# Patient Record
Sex: Male | Born: 1950 | Race: White | Hispanic: No | Marital: Married | Smoking: Never smoker
Health system: Southern US, Community
[De-identification: ages and names within clinical notes are randomized; demographics above are authoritative.]

## PROBLEM LIST (undated history)

## (undated) DIAGNOSIS — G40909 Epilepsy, unspecified, not intractable, without status epilepticus: Secondary | ICD-10-CM

## (undated) HISTORY — PX: BILATERAL CARPAL TUNNEL RELEASE: SHX6508

---

## 2017-01-02 ENCOUNTER — Emergency Department (HOSPITAL_COMMUNITY)
Admission: EM | Admit: 2017-01-02 | Discharge: 2017-01-02 | Disposition: A | Discharge status: Home / Self Care | Payer: BLUE CROSS/BLUE SHIELD | Attending: Emergency Medicine | Admitting: Emergency Medicine

## 2017-01-02 ENCOUNTER — Emergency Department (HOSPITAL_COMMUNITY): Payer: BLUE CROSS/BLUE SHIELD

## 2017-01-02 ENCOUNTER — Encounter (HOSPITAL_COMMUNITY): Payer: Self-pay

## 2017-01-02 DIAGNOSIS — Z79899 Other long term (current) drug therapy: Secondary | ICD-10-CM | POA: Diagnosis not present

## 2017-01-02 DIAGNOSIS — H532 Diplopia: Secondary | ICD-10-CM | POA: Diagnosis not present

## 2017-01-02 DIAGNOSIS — I639 Cerebral infarction, unspecified: Secondary | ICD-10-CM | POA: Diagnosis present

## 2017-01-02 HISTORY — DX: Epilepsy, unspecified, not intractable, without status epilepticus: G40.909

## 2017-01-02 LAB — CBC
HCT: 42.4 % (ref 39.0–52.0)
HEMOGLOBIN: 14.6 g/dL (ref 13.0–17.0)
MCH: 31.8 pg (ref 26.0–34.0)
MCHC: 34.4 g/dL (ref 30.0–36.0)
MCV: 92.4 fL (ref 78.0–100.0)
Platelets: 181 10*3/uL (ref 150–400)
RBC: 4.59 MIL/uL (ref 4.22–5.81)
RDW: 12.4 % (ref 11.5–15.5)
WBC: 6.2 10*3/uL (ref 4.0–10.5)

## 2017-01-02 LAB — COMPREHENSIVE METABOLIC PANEL
ALBUMIN: 3.8 g/dL (ref 3.5–5.0)
ALT: 13 U/L — AB (ref 17–63)
AST: 15 U/L (ref 15–41)
Alkaline Phosphatase: 45 U/L (ref 38–126)
Anion gap: 9 (ref 5–15)
BILIRUBIN TOTAL: 0.4 mg/dL (ref 0.3–1.2)
BUN: 11 mg/dL (ref 6–20)
CO2: 21 mmol/L — ABNORMAL LOW (ref 22–32)
Calcium: 8.7 mg/dL — ABNORMAL LOW (ref 8.9–10.3)
Chloride: 107 mmol/L (ref 101–111)
Creatinine, Ser: 0.75 mg/dL (ref 0.61–1.24)
GFR calc Af Amer: >60 mL/min (ref >60)
GFR calc non Af Amer: >60 mL/min (ref >60)
GLUCOSE: 101 mg/dL — AB (ref 65–99)
POTASSIUM: 3.6 mmol/L (ref 3.5–5.1)
Sodium: 137 mmol/L (ref 135–145)
TOTAL PROTEIN: 5.9 g/dL — AB (ref 6.5–8.1)

## 2017-01-02 LAB — DIFFERENTIAL
BASOS ABS: 0 10*3/uL (ref 0.0–0.1)
Basophils Relative: 1 %
EOS ABS: 0.2 10*3/uL (ref 0.0–0.7)
Eosinophils Relative: 3 %
LYMPHS ABS: 1.4 10*3/uL (ref 0.7–4.0)
LYMPHS PCT: 23 %
Monocytes Absolute: 0.5 10*3/uL (ref 0.1–1.0)
Monocytes Relative: 9 %
NEUTROS ABS: 4 10*3/uL (ref 1.7–7.7)
NEUTROS PCT: 64 %

## 2017-01-02 LAB — I-STAT CHEM 8, ED
BUN: 18 mg/dL (ref 6–20)
CALCIUM ION: 1.11 mmol/L — AB (ref 1.15–1.40)
CHLORIDE: 103 mmol/L (ref 101–111)
Creatinine, Ser: 0.7 mg/dL (ref 0.61–1.24)
Glucose, Bld: 96 mg/dL (ref 65–99)
HEMATOCRIT: 43 % (ref 39.0–52.0)
Hemoglobin: 14.6 g/dL (ref 13.0–17.0)
Potassium: 4.8 mmol/L (ref 3.5–5.1)
SODIUM: 140 mmol/L (ref 135–145)
TCO2: 29 mmol/L (ref 0–100)

## 2017-01-02 LAB — CBG MONITORING, ED: Glucose-Capillary: 94 mg/dL (ref 65–99)

## 2017-01-02 LAB — PROTIME-INR
INR: 0.99
Prothrombin Time: 13.1 seconds (ref 11.4–15.2)

## 2017-01-02 LAB — I-STAT TROPONIN, ED: Troponin i, poc: 0 ng/mL (ref 0.00–0.08)

## 2017-01-02 LAB — APTT: APTT: 25 s (ref 24–36)

## 2017-01-02 MED ORDER — IOPAMIDOL (ISOVUE-370) INJECTION 76%
INTRAVENOUS | Status: AC
  Filled 2017-01-02: qty 50

## 2017-01-02 NOTE — ED Notes (Signed)
Pt has returned from MRI. 

## 2017-01-02 NOTE — ED Notes (Addendum)
Pt reports his diplopia has returned. Dr. Clayborne DanaMESNER informed. No change in neuro status/exam.

## 2017-01-02 NOTE — ED Notes (Signed)
Pt is a 66 y/o male from [place], [place] with PMHx of recent diagnosis of Epilepsy (takes Lamictal) arrives POV with c/o sudden onset of diplopia, trouble focusing, feeling off balance with ambulation and stated 'I feel drunk." These symptoms started today while driving around 3662. He states he stopped the car and had a family member continue driving. He reports the diplopia has resolved, which lasted about 4 hours, but reports current "brain feels in a fog." Upon arrival, patient was taken to CT and met by Dr. Leonel Ramsay. After CT head completed, pt was able to walk independently to ED stretcher with standby assist by this RN and Dr. Leonel Ramsay. During ambulation, pt verbalized feeling off balance.

## 2017-01-02 NOTE — ED Notes (Signed)
Pt is at MRI

## 2017-01-02 NOTE — Consult Note (Signed)
Neurology Consultation Reason for Consult: diploplia Referring Physician: Mesner, J  CC: Diplplia  History is obtained from: Patient  HPI: Purcell MoutonJohn Merry is a 66 y.o. male with a history of epilepsy diagnosed last year who presents with diplopia, unsteadiness, lethargy that started earlier today. He took his lamotrigine around 10:30 AM on an empty stomach, and subsequently around 11:30 to noon began having symptoms of lethargy, diplopia. This progressively worsened over the course of an hour or 2 and then has gradually started improving. He currently feels like he is almost back to baseline, but still feels slightly unsteady when walking.   LKW: 11:30 am tpa given?: no, mild, resolving symptoms   ROS: A 14 point ROS was performed and is negative except as noted in the HPI.   Past Medical History:  Diagnosis Date  . Epilepsy (HCC)      Family history: No history of similar   Social History:  reports that he has never smoked. He has never used smokeless tobacco. He reports that he drinks alcohol. He reports that he does not use drugs.   Exam: Current vital signs: BP 124/87   Pulse (!) 51   Temp 97.9 F (36.6 C)   Resp (!) 21   Wt 76 kg (167 lb 8.8 oz)   SpO2 99%  Vital signs in last 24 hours: Temp:  [97.9 F (36.6 C)] 97.9 F (36.6 C) (07/16 1815) Pulse Rate:  [51-57] 51 (07/16 1900) Resp:  [16-21] 21 (07/16 1900) BP: (120-124)/(79-87) 124/87 (07/16 1900) SpO2:  [95 %-99 %] 99 % (07/16 1900) Weight:  [76 kg (167 lb 8.8 oz)] 76 kg (167 lb 8.8 oz) (07/16 1630)   Physical Exam  Constitutional: Appears well-developed and well-nourished.  Psych: Affect appropriate to situation Eyes: No scleral injection HENT: No OP obstrucion Head: Normocephalic.  Cardiovascular: Normal rate and regular rhythm.  Respiratory: Effort normal and breath sounds normal to anterior ascultation GI: Soft.  No distension. There is no tenderness.  Skin: WDI  Neuro: Mental Status: Patient is  awake, alert, oriented to person, place, month, year, and situation. Patient is able to give a clear and coherent history. No signs of aphasia or neglect Cranial Nerves: II: Visual Fields are full. Pupils are equal, round, and reactive to light.   III,IV, VI: EOMI without ptosis or diploplia.  V: Facial sensation is symmetric to temperature VII: Facial movement is symmetric.  VIII: hearing is intact to voice X: Uvula elevates symmetrically XI: Shoulder shrug is symmetric. XII: tongue is midline without atrophy or fasciculations.  Motor: Tone is normal. Bulk is normal. 5/5 strength was present in all four extremities.  Sensory: Sensation is symmetric to light touch and temperature in the arms and legs. Plantars: Toes are downgoing bilaterally.  Cerebellar: FNF and HKS are intact bilaterally Gait: Able to walk unassisted, but is slightly unsteady   I have reviewed labs in epic and the results pertinent to this consultation are: CMP-unremarkable  I have reviewed the images obtained: CT head-unremarkable  Impression: 66 year old male with gradual onset lethargy, diplopia,  Unsteadiness/dizziness that started after taking his medication on an empty stomach(normally takes with food). I do wonder if he normally lives close to the higher side of lamotrigine level and because he has not been eating or drinking well, and to come in and be stomach, had a higher blood level than typical resulting in lamotrigine toxicity.  The gradual onset would argue against either seizure or stroke as a    However given  the symptoms that he is describing I do think an MRI would need to be considered.  Recommendations: 1) MRI brain, if positive would recommend admission for stroke workup 2) lamotrigine level 3) if MRI is negative, then I would favor following up with his outpatient neurologist   Ritta Slot, MD [facility] [phone]  If 7pm- 7am, please page neurology on call as  listed in [facility].

## 2017-01-02 NOTE — ED Notes (Signed)
This RN called to determine when pt would be transported to MRI; was told he will be put in for transport in about 30 minutes.Adam Rivas. MRI informed this is a code stroke.

## 2017-01-02 NOTE — ED Notes (Signed)
Pt en-route from wedding.  States acute onset double vision and ataxia at 1230.  Dr Erma HeritageIsaacs stated to call code stroke.  Recent dx of seizure disorder.

## 2017-01-03 NOTE — ED Provider Notes (Signed)
[facility]-EMERGENCY DEPT Provider Note   CSN: [id number] Arrival date & time: 01/02/17  1623     History   Chief Complaint Chief Complaint  Patient presents with  . Code Stroke    HPI Purcell MoutonJohn Sciarra is a 66 y.o. male.   Neurologic Problem  This is a new problem. The current episode started 3 to 5 hours ago. The problem occurs hourly. The problem has been gradually improving. Nothing aggravates the symptoms.    Past Medical History:  Diagnosis Date  . Epilepsy (HCC)     There are no active problems to display for this patient.   Past Surgical History:  Procedure Laterality Date  . BILATERAL CARPAL TUNNEL RELEASE         Home Medications    Prior to Admission medications   Medication Sig Start Date End Date Taking? Authorizing Provider  cholecalciferol (VITAMIN D) 1000 units tablet Take 1,000 Units by mouth daily.   Yes [provider]  famotidine (PEPCID) 10 MG tablet Take 10 mg by mouth daily as needed for heartburn or indigestion.   Yes [provider]  fluticasone (FLONASE) 50 MCG/ACT nasal spray Place 2 sprays into both nostrils at bedtime as needed for allergies or rhinitis (congestion).   Yes [provider]  lamoTRIgine (LAMICTAL) 100 MG tablet Take 150 mg by mouth 2 (two) times daily.   Yes [provider]  LORazepam (ATIVAN) 0.5 MG tablet Take 0.5 mg by mouth at bedtime as needed for seizure or sleep.    Yes [provider]  montelukast (SINGULAIR) 10 MG tablet Take 10 mg by mouth at bedtime.   Yes [provider]  naproxen sodium (ALEVE) 220 MG tablet Take 440 mg by mouth 2 (two) times daily as needed (pain/headache).   Yes [provider]  PRESCRIPTION MEDICATION Inhale into the lungs at bedtime. CPAP   Yes [provider]    Family History No family history on file.  Social History Social History  Substance Use Topics  . Smoking status: Never Smoker  . Smokeless tobacco: Never Used    . Alcohol use Yes     Allergies   Patient has no known allergies.   Review of Systems Review of Systems  Neurological: Positive for dizziness and speech difficulty.       Double vision  All other systems reviewed and are negative.    Physical Exam Updated Vital Signs BP 119/84   Pulse (!) 50   Temp 97.9 F (36.6 C)   Resp 16   Wt 76 kg (167 lb 8.8 oz)   SpO2 98%   Physical Exam  Constitutional: He appears well-developed and well-nourished.  HENT:  Head: Normocephalic and atraumatic.  Neck: Normal range of motion.  Cardiovascular: Normal rate.   Pulmonary/Chest: Effort normal. No respiratory distress.  Abdominal: He exhibits no distension.  Musculoskeletal: Normal range of motion.  Neurological: He is alert.  No altered mental status, able to give full seemingly accurate history.  Face is symmetric, EOM's intact, pupils equal and reactive, vision intact, tongue and uvula midline without deviation. Upper and Lower extremity motor 5/5, intact pain perception in distal extremities, 2+ reflexes in biceps, patella and achilles tendons. Able to perform finger to nose normal with both hands. After period of observation in ED, Walks without assistance or evident ataxia.    Nursing note and vitals reviewed.    ED Treatments / Results  Labs (all labs ordered are listed, but only abnormal results are displayed)  Labs Reviewed  COMPREHENSIVE METABOLIC PANEL - Abnormal; Notable for the following:       Result Value   CO2 21 (*)    Glucose, Bld 101 (*)    Calcium 8.7 (*)    Total Protein 5.9 (*)    ALT 13 (*)    All other components within normal limits  I-STAT CHEM 8, ED - Abnormal; Notable for the following:    Calcium, Ion 1.11 (*)    All other components within normal limits  PROTIME-INR  APTT  CBC  DIFFERENTIAL  LAMOTRIGINE LEVEL  I-STAT TROPOININ, ED  CBG MONITORING, ED    EKG  EKG Interpretation None       Radiology Mr Brain Wo Contrast  Result  Date: 01/02/2017 CLINICAL DATA:  66 y/o  M; acute onset double vision and ataxia. EXAM: MRI HEAD WITHOUT CONTRAST TECHNIQUE: Multiplanar, multiecho pulse sequences of the brain and surrounding structures were obtained without intravenous contrast. COMPARISON:  01/02/2017 CT of head. FINDINGS: Brain: No acute infarction, hemorrhage, hydrocephalus, extra-axial collection or mass lesion. Small right parietal developmental venous anomaly. No significant T2 FLAIR signal abnormality of the brain. Vascular: Normal flow voids. Skull and upper cervical spine: Normal marrow signal. Sinuses/Orbits: Mild maxillary sinus mucosal thickening. Otherwise negative. Other: Negative. IMPRESSION: No acute intracranial abnormality. Unremarkable MRI of the brain for age. Electronically Signed   By: Mitzi Hansen M.D.   On: 01/02/2017 20:27   Ct Head Code Stroke W/o Cm  Result Date: 01/02/2017 CLINICAL DATA:  Code stroke.  Ataxia.  Blurred vision and dizziness. EXAM: CT HEAD WITHOUT CONTRAST TECHNIQUE: Contiguous axial images were obtained from the base of the skull through the vertex without intravenous contrast. COMPARISON:  None. FINDINGS: Brain: There is no evidence of acute infarct, intracranial hemorrhage, mass, midline shift, or extra-axial fluid collection. The ventricles and sulci are normal. Vascular: Mild calcified atherosclerosis at the skullbase. No hyperdense vessel. Skull: No fracture or focal osseous lesion. Sinuses/Orbits: Mild mucosal thickening in the ethmoid air cells bilaterally. Small left maxillary sinus mucous retention cyst. Clear mastoid air cells. Unremarkable orbits. Other: None. ASPECTS [facility] Stroke Program Early CT Score) - Ganglionic level infarction (caudate, lentiform nuclei, internal capsule, insula, M1-M3 cortex): 7 - Supraganglionic infarction (M4-M6 cortex): 3 Total score (0-10 with 10 being normal): 10 IMPRESSION: 1. No evidence of acute intracranial abnormality. 2. ASPECTS is 10.  Results sent via text page to Dr. Amada Jupiter on 01/02/2017 at 4:58 p.m. Electronically Signed   By: Sebastian Ache M.D.   On: 01/02/2017 16:58    Procedures Procedures (including critical care time)  Medications Ordered in ED Medications - No data to display   Initial Impression / Assessment and Plan / ED Course  I have reviewed the triage vital signs and the nursing notes.  Pertinent labs & imaging results that were available during my care of the patient were reviewed by me and considered in my medical decision making (see chart for details).     Code stroke 2/2 acute onset of symptoms however on arrival, symptoms seem to be improving. Thus no tPA. After full workup as above, more c/w possible lamictal toxicity. Will decrease dosing an ask to follow up with his home neurologist. Ambulating, vision and neuro exam otherwise all normal.  Final Clinical Impressions(s) / ED Diagnoses   Final diagnoses:  Diplopia    New Prescriptions Discharge Medication List as of 01/02/2017  9:02 PM       Pina Sirianni, Barbara Cower, MD 01/03/17 1727

## 2017-01-04 LAB — LAMOTRIGINE LEVEL: Lamotrigine Lvl: 2.6 ug/mL (ref 2.0–20.0)

## 2018-01-05 IMAGING — MR MR HEAD W/O CM
10 of 12 series · 33 of 48 positions shown · non-contrast
Comparison: 01/02/2017 CT of head.

CLINICAL DATA: 66 y/o  M; acute onset double vision and ataxia.

EXAM:
MRI HEAD WITHOUT CONTRAST
TECHNIQUE: Multiplanar, multiecho pulse sequences of the brain and surrounding
structures were obtained without intravenous contrast.

[Series 3: DWI · axial · 3.0mm · 0.94mm/px · z∈[-148,+15]mm · 8 of 114 slices shown (1 of 2)]
[im 1/114]
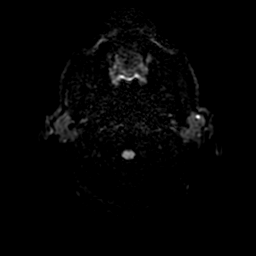
[im 17/114]
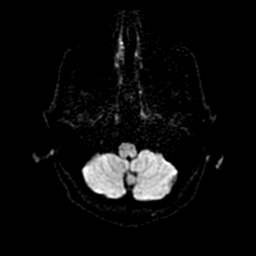
[im 33/114]
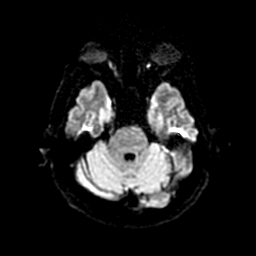
[im 49/114]
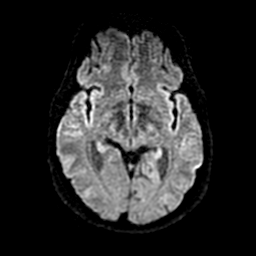
[im 65/114]
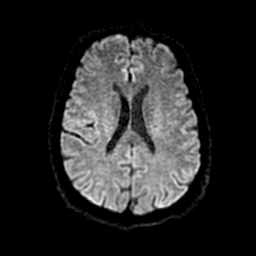
[im 81/114]
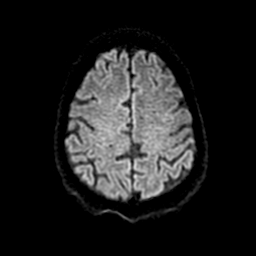
[im 97/114]
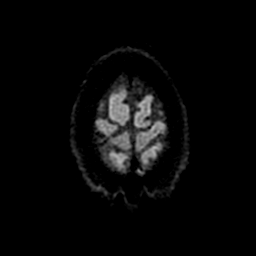
[im 114/114]
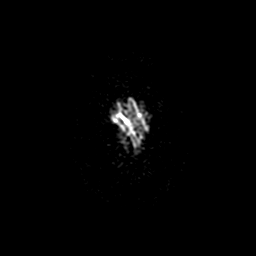

[Series 4: FLAIR · sagittal · 5.0mm · 0.47mm/px · 2 of 25 slices shown (1 of 2)]
[im 1/25]
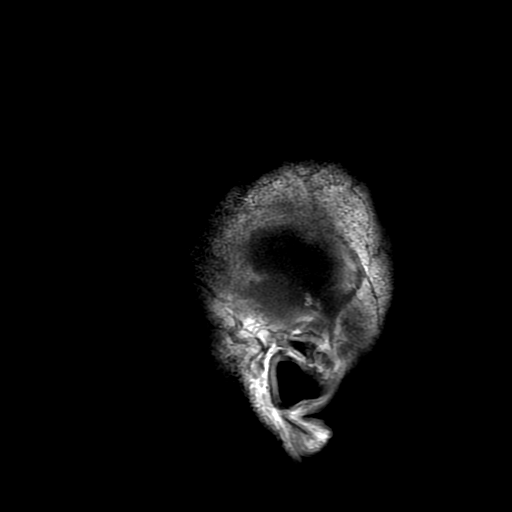
[im 25/25]
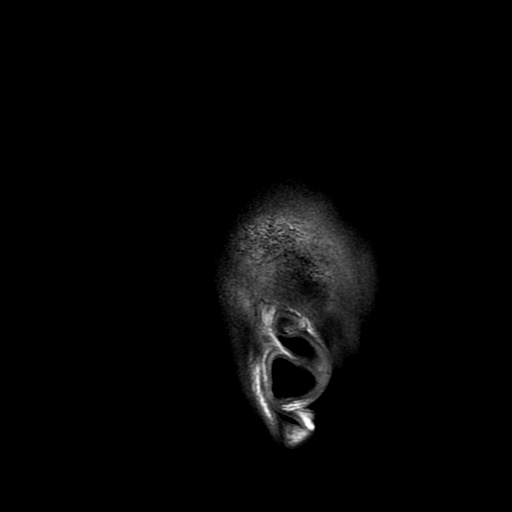

[Series 5: T2 · axial · 5.0mm · 0.47mm/px · z∈[-137,+29]mm · 2 of 29 slices shown (1 of 2)]
[im 1/29]
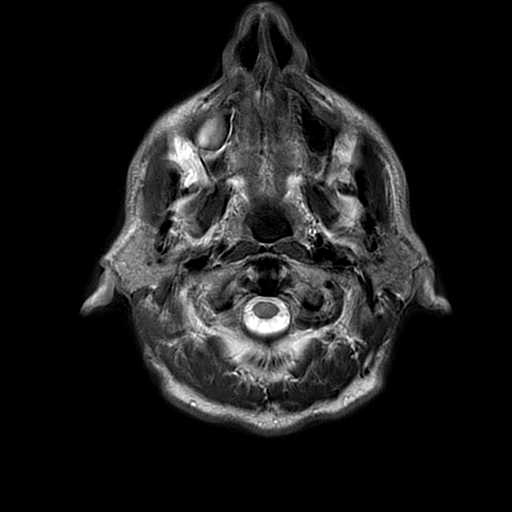
[im 29/29]
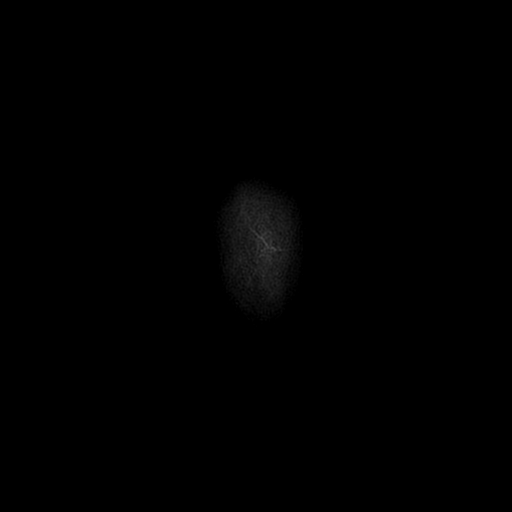

[Series 6: FLAIR · axial · 4.0mm · 0.47mm/px · z∈[-128,+22]mm · 2 of 29 slices shown (2 of 2)]
[im 1/29]
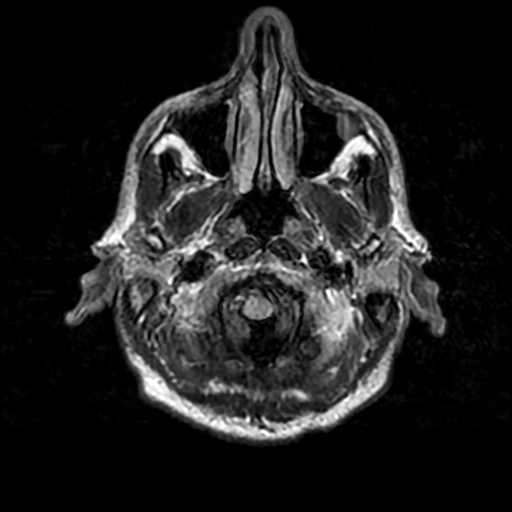
[im 29/29]
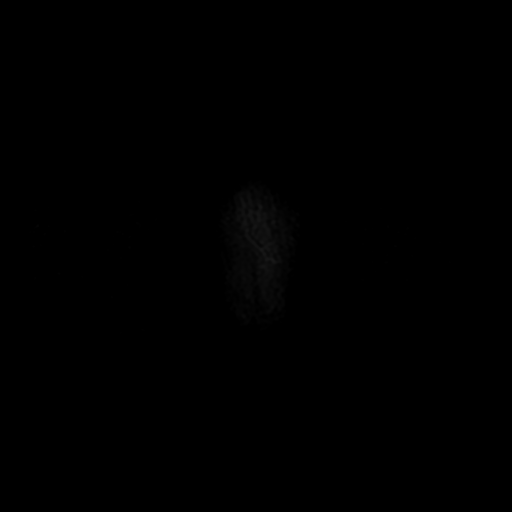

[Series 7: DWI · coronal · 4.0mm · 0.94mm/px · 5 of 78 slices shown (2 of 2)]
[im 1/78]
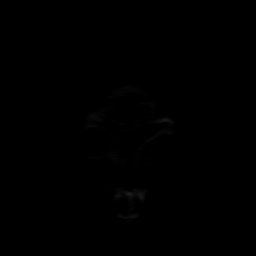
[im 20/78]
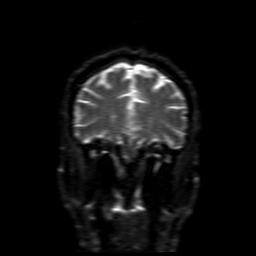
[im 39/78]
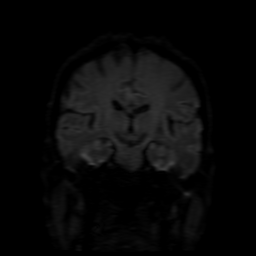
[im 58/78]
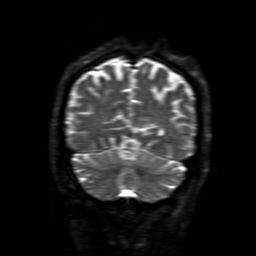
[im 78/78]
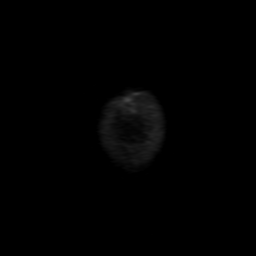

[Series 8: (person_name) · axial · 3.0mm · 0.47mm/px · z∈[-133,-81]mm · 3 of 108 slices shown]
[im 1/108]
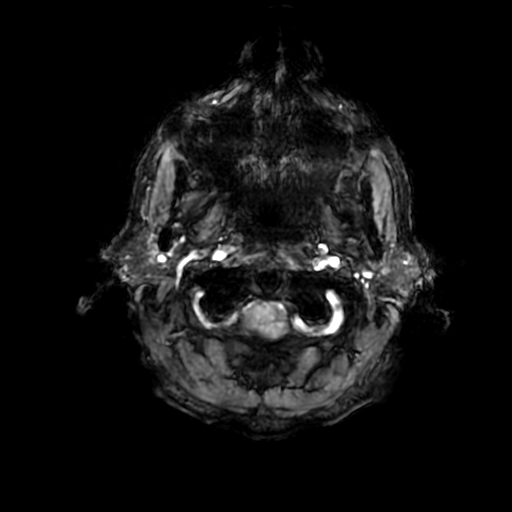
[im 18/108]
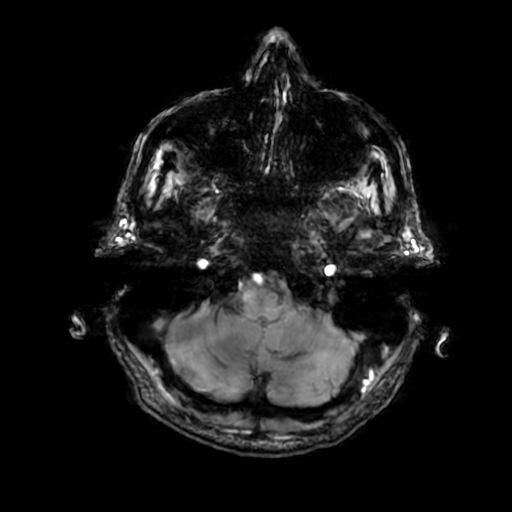
[im 36/108]
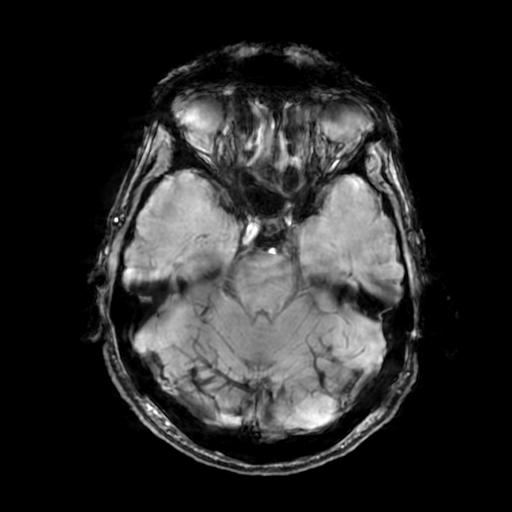

[Series 11: T2 · coronal · 5.0mm · 0.39mm/px · 2 of 33 slices shown (2 of 2)]
[im 1/33]
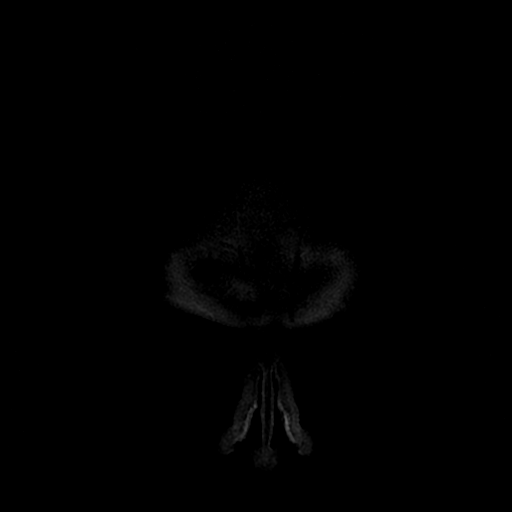
[im 33/33]
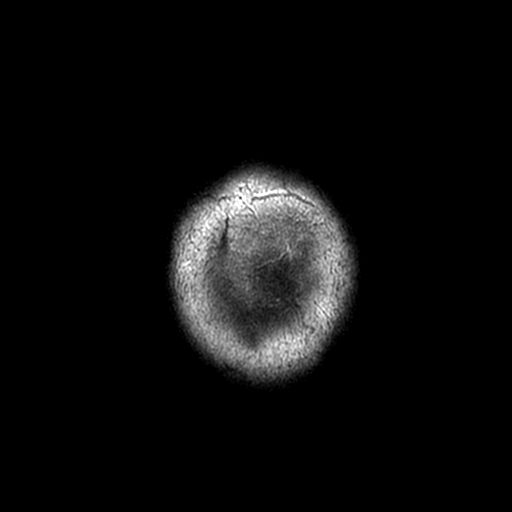

[Series 12: T2 fat-sat · coronal · 3.0mm · 0.35mm/px · 2 of 27 slices shown]
[im 1/27]
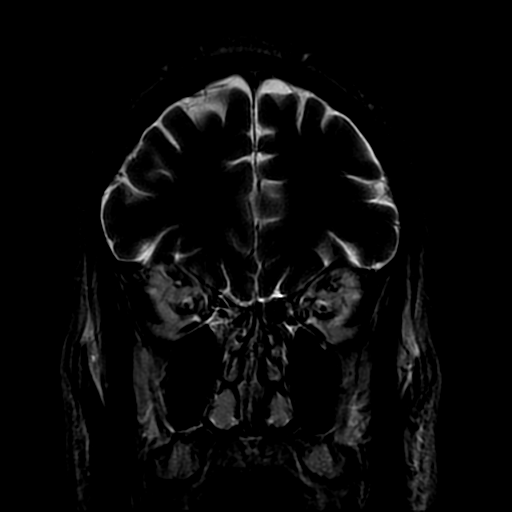
[im 27/27]
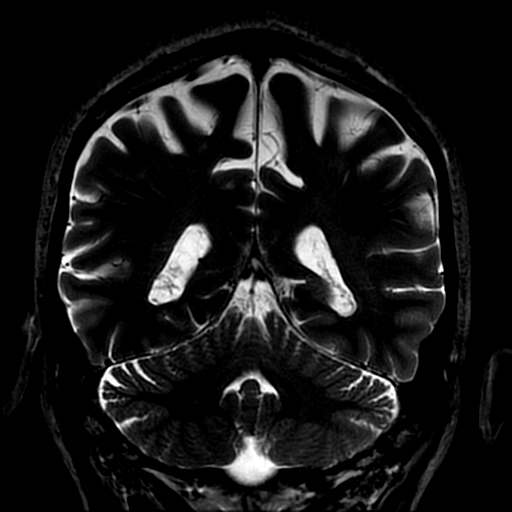

[Series 350: ADC · axial · 3.0mm · 0.94mm/px · z∈[-148,+15]mm · 4 of 57 slices shown (1 of 2)]
[im 1/57]
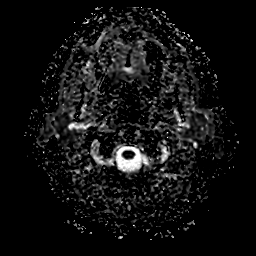
[im 19/57]
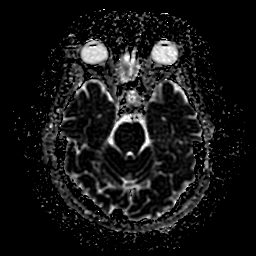
[im 38/57]
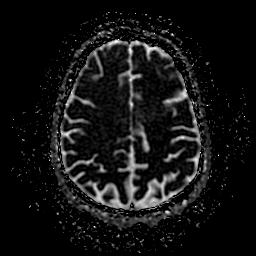
[im 57/57]
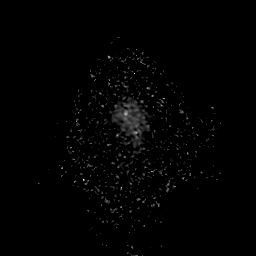

[Series 750: ADC · coronal · 4.0mm · 0.94mm/px · 3 of 39 slices shown (2 of 2)]
[im 1/39]
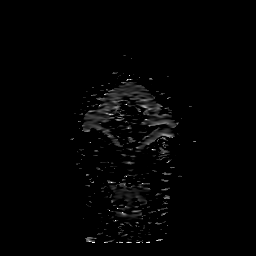
[im 20/39]
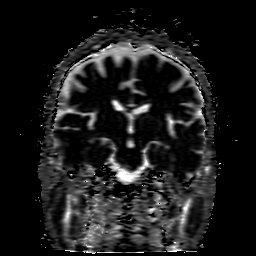
[im 39/39]
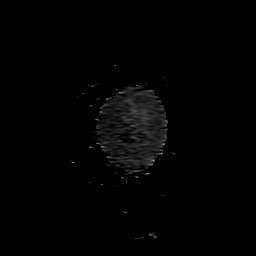

[33 of 48 positions shown; findings below may reference images not displayed]

FINDINGS: Brain: No acute infarction, hemorrhage, hydrocephalus, extra-axial
collection or mass lesion. Small right parietal developmental venous
anomaly. No significant T2 FLAIR signal abnormality of the brain.

Vascular: Normal flow voids.

Skull and upper cervical spine: Normal marrow signal.

Sinuses/Orbits: Mild maxillary sinus mucosal thickening. Otherwise
negative.

Other: Negative.
IMPRESSION: No acute intracranial abnormality. Unremarkable MRI of the brain for
age.

By: Aciuka Nazarovas M.D.
